# Patient Record
Sex: Male | Born: 1960 | Race: White | Hispanic: No | Smoking: Never smoker
Health system: Southern US, Community
[De-identification: ages and names within clinical notes are randomized; demographics above are authoritative.]

## PROBLEM LIST (undated history)

## (undated) DIAGNOSIS — F1010 Alcohol abuse, uncomplicated: Secondary | ICD-10-CM

## (undated) DIAGNOSIS — M24219 Disorder of ligament, unspecified shoulder: Secondary | ICD-10-CM

## (undated) DIAGNOSIS — I10 Essential (primary) hypertension: Secondary | ICD-10-CM

## (undated) DIAGNOSIS — F319 Bipolar disorder, unspecified: Secondary | ICD-10-CM

---

## 2014-05-31 ENCOUNTER — Encounter (HOSPITAL_BASED_OUTPATIENT_CLINIC_OR_DEPARTMENT_OTHER): Payer: Self-pay

## 2014-05-31 ENCOUNTER — Emergency Department (HOSPITAL_BASED_OUTPATIENT_CLINIC_OR_DEPARTMENT_OTHER)
Admission: EM | Admit: 2014-05-31 | Discharge: 2014-05-31 | Disposition: A | Discharge status: Home / Self Care | Payer: Self-pay | Attending: Emergency Medicine | Admitting: Emergency Medicine

## 2014-05-31 DIAGNOSIS — I10 Essential (primary) hypertension: Secondary | ICD-10-CM | POA: Insufficient documentation

## 2014-05-31 DIAGNOSIS — Z79899 Other long term (current) drug therapy: Secondary | ICD-10-CM | POA: Insufficient documentation

## 2014-05-31 DIAGNOSIS — Z8739 Personal history of other diseases of the musculoskeletal system and connective tissue: Secondary | ICD-10-CM | POA: Insufficient documentation

## 2014-05-31 DIAGNOSIS — F319 Bipolar disorder, unspecified: Secondary | ICD-10-CM | POA: Insufficient documentation

## 2014-05-31 HISTORY — DX: Alcohol abuse, uncomplicated: F10.10

## 2014-05-31 HISTORY — DX: Bipolar disorder, unspecified: F31.9

## 2014-05-31 HISTORY — DX: Disorder of ligament, unspecified shoulder: M24.219

## 2014-05-31 HISTORY — DX: Essential (primary) hypertension: I10

## 2014-05-31 LAB — BASIC METABOLIC PANEL
Anion gap: 9 (ref 5–15)
BUN: 16 mg/dL (ref 6–23)
CO2: 23 mmol/L (ref 19–32)
CREATININE: 0.95 mg/dL (ref 0.50–1.35)
Calcium: 9.6 mg/dL (ref 8.4–10.5)
Chloride: 106 mEq/L (ref 96–112)
GFR calc non Af Amer: >90 mL/min (ref >90)
Glucose, Bld: 107 mg/dL — ABNORMAL HIGH (ref 70–99)
Potassium: 3.7 mmol/L (ref 3.5–5.1)
Sodium: 138 mmol/L (ref 135–145)

## 2014-05-31 NOTE — ED Notes (Signed)
Pt reports he was being [facility] today and BP was elevated.  Staff sent pt to ED for evaluation.  Pt tells me sometimes he forgets to take his Metoprolol about 2-3 times a week.  Denies any symptoms other than anxiety.

## 2014-05-31 NOTE — ED Notes (Addendum)
Pt reports [facility] BP "191/111"-day 1 at [facility]

## 2014-05-31 NOTE — Discharge Instructions (Signed)
Hypertension °Hypertension, commonly called high blood pressure, is when the force of blood pumping through your arteries is too strong. Your arteries are the blood vessels that carry blood from your heart throughout your body. A blood pressure reading consists of a higher number over a lower number, such as 110/72. The higher number (systolic) is the pressure inside your arteries when your heart pumps. The lower number (diastolic) is the pressure inside your arteries when your heart relaxes. Ideally you want your blood pressure below 120/80. °Hypertension forces your heart to work harder to pump blood. Your arteries may become narrow or stiff. Having hypertension puts you at risk for heart disease, stroke, and other problems.  °RISK FACTORS °Some risk factors for high blood pressure are controllable. Others are not.  °Risk factors you cannot control include:  °· Race. You may be at higher risk if you are African American. °· Age. Risk increases with age. °· Gender. Men are at higher risk than women before age 45 years. After age 65, women are at higher risk than men. °Risk factors you can control include: °· Not getting enough exercise or physical activity. °· Being overweight. °· Getting too much fat, sugar, calories, or salt in your diet. °· Drinking too much alcohol. °SIGNS AND SYMPTOMS °Hypertension does not usually cause signs or symptoms. Extremely high blood pressure (hypertensive crisis) may cause headache, anxiety, shortness of breath, and nosebleed. °DIAGNOSIS  °To check if you have hypertension, your health care provider will measure your blood pressure while you are seated, with your arm held at the level of your heart. It should be measured at least twice using the same arm. Certain conditions can cause a difference in blood pressure between your right and left arms. A blood pressure reading that is higher than normal on one occasion does not mean that you need treatment. If one blood pressure reading  is high, ask your health care provider about having it checked again. °TREATMENT  °Treating high blood pressure includes making lifestyle changes and possibly taking medicine. Living a healthy lifestyle can help lower high blood pressure. You may need to change some of your habits. °Lifestyle changes may include: °· Following the DASH diet. This diet is high in fruits, vegetables, and whole grains. It is low in salt, red meat, and added sugars. °· Getting at least 2½ hours of brisk physical activity every week. °· Losing weight if necessary. °· Not smoking. °· Limiting alcoholic beverages. °· Learning ways to reduce stress. ° If lifestyle changes are not enough to get your blood pressure under control, your health care provider may prescribe medicine. You may need to take more than one. Work closely with your health care provider to understand the risks and benefits. °HOME CARE INSTRUCTIONS °· Have your blood pressure rechecked as directed by your health care provider.   °· Take medicines only as directed by your health care provider. Follow the directions carefully. Blood pressure medicines must be taken as prescribed. The medicine does not work as well when you skip doses. Skipping doses also puts you at risk for problems.   °· Do not smoke.   °· Monitor your blood pressure at home as directed by your health care provider.  °SEEK MEDICAL CARE IF:  °· You think you are having a reaction to medicines taken. °· You have recurrent headaches or feel dizzy. °· You have swelling in your ankles. °· You have trouble with your vision. °SEEK IMMEDIATE MEDICAL CARE IF: °· You develop a severe headache or confusion. °·   You have unusual weakness, numbness, or feel faint. °· You have severe chest or abdominal pain. °· You vomit repeatedly. °· You have trouble breathing. °MAKE SURE YOU:  °· Understand these instructions. °· Will watch your condition. °· Will get help right away if you are not doing well or get worse. °Document  Released: 05/20/2005 Document Revised: 10/04/2013 Document Reviewed: 03/12/2013 °ExitCare® Patient Information ©2015 [employer]. This information is not intended to replace advice given to you by your health care provider. Make sure you discuss any questions you have with your health care provider. ° °Insomnia °Insomnia is frequent trouble falling and/or staying asleep. Insomnia can be a long term problem or a short term problem. Both are common. Insomnia can be a short term problem when the wakefulness is related to a certain stress or worry. Long term insomnia is often related to ongoing stress during waking hours and/or poor sleeping habits. Overtime, sleep deprivation itself can make the problem worse. Every little thing feels more severe because you are overtired and your ability to cope is decreased. °CAUSES  °· Stress, anxiety, and depression. °· Poor sleeping habits. °· Distractions such as TV in the bedroom. °· Naps close to bedtime. °· Engaging in emotionally charged conversations before bed. °· Technical reading before sleep. °· Alcohol and other sedatives. They may make the problem worse. They can hurt normal sleep patterns and normal dream activity. °· Stimulants such as caffeine for several hours prior to bedtime. °· Pain syndromes and shortness of breath can cause insomnia. °· Exercise late at night. °· Changing time zones may cause sleeping problems (jet lag). °It is sometimes helpful to have someone observe your sleeping patterns. They should look for periods of not breathing during the night (sleep apnea). They should also look to see how long those periods last. If you live alone or observers are uncertain, you can also be observed at a sleep clinic where your sleep patterns will be professionally monitored. Sleep apnea requires a checkup and treatment. Give your caregivers your medical history. Give your caregivers observations your family has made about your sleep.  °SYMPTOMS  °· Not feeling  rested in the morning. °· Anxiety and restlessness at bedtime. °· Difficulty falling and staying asleep. °TREATMENT  °· Your caregiver may prescribe treatment for an underlying medical disorders. Your caregiver can give advice or help if you are using alcohol or other drugs for self-medication. Treatment of underlying problems will usually eliminate insomnia problems. °· Medications can be prescribed for short time use. They are generally not recommended for lengthy use. °· Over-the-counter sleep medicines are not recommended for lengthy use. They can be habit forming. °· You can promote easier sleeping by making lifestyle changes such as: °¨ Using relaxation techniques that help with breathing and reduce muscle tension. °¨ Exercising earlier in the day. °¨ Changing your diet and the time of your last meal. No night time snacks. °¨ Establish a regular time to go to bed. °· Counseling can help with stressful problems and worry. °· Soothing music and white noise may be helpful if there are background noises you cannot remove. °· Stop tedious detailed work at least one hour before bedtime. °HOME CARE INSTRUCTIONS  °· Keep a diary. Inform your caregiver about your progress. This includes any medication side effects. See your caregiver regularly. Take note of: °¨ Times when you are asleep. °¨ Times when you are awake during the night. °¨ The quality of your sleep. °¨ How you feel the next   day. °This information will help your caregiver care for you. °· Get out of bed if you are still awake after 15 minutes. Read or do some quiet activity. Keep the lights down. Wait until you feel sleepy and go back to bed. °· Keep regular sleeping and waking hours. Avoid naps. °· Exercise regularly. °· Avoid distractions at bedtime. Distractions include watching television or engaging in any intense or detailed activity like attempting to balance the household checkbook. °· Develop a bedtime ritual. Keep a familiar routine of bathing,  brushing your teeth, climbing into bed at the same time each night, listening to soothing music. Routines increase the success of falling to sleep faster. °· Use relaxation techniques. This can be using breathing and muscle tension release routines. It can also include visualizing peaceful scenes. You can also help control troubling or intruding thoughts by keeping your mind occupied with boring or repetitive thoughts like the old concept of counting sheep. You can make it more creative like imagining planting one beautiful flower after another in your backyard garden. °· During your day, work to eliminate stress. When this is not possible use some of the previous suggestions to help reduce the anxiety that accompanies stressful situations. °MAKE SURE YOU:  °· Understand these instructions. °· Will watch your condition. °· Will get help right away if you are not doing well or get worse. °Document Released: 05/17/2000 Document Revised: 08/12/2011 Document Reviewed: 06/17/2007 °ExitCare® Patient Information ©2015 [employer]. This information is not intended to replace advice given to you by your health care provider. Make sure you discuss any questions you have with your health care provider. ° °

## 2014-06-04 NOTE — ED Provider Notes (Signed)
CSN: [id number]     Arrival date & time 05/31/14  1142 History   First MD Initiated Contact with Patient 05/31/14 1253     Chief Complaint  Patient presents with  . Hypertension     (Consider location/radiation/quality/duration/timing/severity/associated sxs/prior Treatment) HPI Comments: Pt reports he was being admitted to [facility] today and BP was elevated. Staff sent pt to ED for evaluation. Pt tells me sometimes he forgets to take his Metoprolol about 2-3 times a week. He denies headache, chest pain, shortness of breath, numbness or weakness .     Patient is a 54 y.o. male presenting with hypertension.  Hypertension Pertinent negatives include no chest pain, no abdominal pain, no headaches and no shortness of breath.    Past Medical History  Diagnosis Date  . Hypertension   . Alcohol abuse   . Bipolar disorder   . Disorder of ligament of shoulder    History reviewed. No pertinent past surgical history. No family history on file. History  Substance Use Topics  . Smoking status: Never Smoker   . Smokeless tobacco: Not on file  . Alcohol Use: No     Comment: hx of abuse-in rehab    Review of Systems  Constitutional: Negative for fever, activity change, appetite change and fatigue.  HENT: Negative for congestion, facial swelling, rhinorrhea and trouble swallowing.   Eyes: Negative for photophobia and pain.  Respiratory: Negative for cough, chest tightness and shortness of breath.   Cardiovascular: Negative for chest pain and leg swelling.  Gastrointestinal: Negative for nausea, vomiting, abdominal pain, diarrhea and constipation.  Endocrine: Negative for polydipsia and polyuria.  Genitourinary: Negative for dysuria, urgency, decreased urine volume and difficulty urinating.  Musculoskeletal: Negative for back pain and gait problem.  Skin: Negative for color change, rash and wound.  Allergic/Immunologic: Negative for immunocompromised state.  Neurological: Negative  for dizziness, facial asymmetry, speech difficulty, weakness, numbness and headaches.  Psychiatric/Behavioral: Negative for confusion, decreased concentration and agitation.      Allergies  Review of patient's allergies indicates no known allergies.  Home Medications   Prior to Admission medications   Medication Sig Start Date End Date Taking? Authorizing Provider  Cholecalciferol (D3 MAXIMUM STRENGTH) 5000 UNITS capsule Take 5,000 Units by mouth daily.   Yes Historical Provider, MD  cholecalciferol (VITAMIN D) 400 UNITS TABS tablet Take 400 Units by mouth.   Yes Historical Provider, MD  Cyanocobalamin (B-12 DOTS) 500 MCG TBDP Take by mouth.   Yes Historical Provider, MD  DULoxetine (CYMBALTA) 30 MG capsule Take 30 mg by mouth 2 (two) times daily.   Yes Historical Provider, MD  lithium carbonate 300 MG capsule Take 600 mg by mouth at bedtime.   Yes Historical Provider, MD  metoprolol (LOPRESSOR) 100 MG tablet Take 100 mg by mouth 2 (two) times daily.   Yes Historical Provider, MD  Multiple Vitamins-Minerals (MULTIVITAMIN WITH MINERALS) tablet Take 1 tablet by mouth daily.   Yes Historical Provider, MD  traZODone (DESYREL) 50 MG tablet Take 50 mg by mouth at bedtime.   Yes Historical Provider, MD  vitamin A 8000 UNIT capsule Take 8,000 Units by mouth daily.   Yes Historical Provider, MD   BP 169/104 mmHg  Pulse 55  Temp(Src) 98.9 F (37.2 C) (Oral)  Resp 18  Ht  (1.727 m)  Wt 229 lb (103.874 kg)  BMI 34.83 kg/m2  SpO2 99% Physical Exam  Constitutional: He is oriented to person, place, and time. He appears well-developed and well-nourished. No distress.  HENT:  Head: Normocephalic and atraumatic.  Mouth/Throat: No oropharyngeal exudate.  Eyes: Pupils are equal, round, and reactive to light.  Neck: Normal range of motion. Neck supple.  Cardiovascular: Normal rate, regular rhythm and normal heart sounds.  Exam reveals no gallop and no friction rub.   No murmur  heard. Pulmonary/Chest: Effort normal and breath sounds normal. No respiratory distress. He has no wheezes. He has no rales.  Abdominal: Soft. Bowel sounds are normal. He exhibits no distension and no mass. There is no tenderness. There is no rebound and no guarding.  Musculoskeletal: Normal range of motion. He exhibits no edema or tenderness.  Neurological: He is alert and oriented to person, place, and time. He has normal strength. He displays no atrophy and no tremor. No cranial nerve deficit or sensory deficit. He exhibits normal muscle tone. He displays a negative Romberg sign. Coordination and gait normal. GCS eye subscore is 4. GCS verbal subscore is 5. GCS motor subscore is 6.  Skin: Skin is warm and dry.  Psychiatric: He has a normal mood and affect.    ED Course  Procedures (including critical care time) Labs Review Labs Reviewed  BASIC METABOLIC PANEL - Abnormal; Notable for the following:    Glucose, Bld 107 (*)    All other components within normal limits    Imaging Review No results found.   EKG Interpretation   Date/Time:  Tuesday May 31 2014 12:53:01 EST Ventricular Rate:  62 PR Interval:  112 QRS Duration: 106 QT Interval:  460 QTC Calculation: 466 R Axis:   26 Text Interpretation:  Sinus rhythm with Premature atrial complexes  Nonspecific T wave abnormality Prolonged QT Abnormal ECG No prior for  comparison Confirmed by DOCHERTY  MD, MEGAN ([id number]) on 05/31/2014 12:55:01  PM      MDM   Final diagnoses:  Essential hypertension    Pt is a 54 y.o. male with Pmhx as above who presents with complaint of hypertension after trying to be admitted today marked today.  He denies headache, chest pain, shortness of breath, decreased urinary output.  He has no signs of end organ damage on physical exam, including normal neuro exam.  Normal cardiopulmonary exam.  Labs show a normal creatinine.  He states that he did not take his metoprolol today.  I encouraged him  to take it regularly.  I do not feel he needs other medication adjustments at this time and can follow up with his primary physician.      Lupita Dawn evaluation in the Emergency Department is complete. It has been determined that no acute conditions requiring further emergency intervention are present at this time. The patient/guardian have been advised of the diagnosis and plan. We have discussed signs and symptoms that warrant return to the ED, such as changes or worsening in symptoms, chest pain, shortness of breath, numbness, weakness      Toy Cookey, MD 06/04/14 1734

## 2014-11-19 ENCOUNTER — Emergency Department (HOSPITAL_COMMUNITY)
Admission: EM | Admit: 2014-11-19 | Discharge: 2014-11-20 | Disposition: A | Discharge status: Home / Self Care | Payer: Self-pay

## 2014-11-19 ENCOUNTER — Encounter (HOSPITAL_COMMUNITY): Payer: Self-pay | Admitting: Nurse Practitioner

## 2014-11-19 DIAGNOSIS — R45851 Suicidal ideations: Secondary | ICD-10-CM

## 2014-11-19 DIAGNOSIS — Z8739 Personal history of other diseases of the musculoskeletal system and connective tissue: Secondary | ICD-10-CM | POA: Insufficient documentation

## 2014-11-19 DIAGNOSIS — F1010 Alcohol abuse, uncomplicated: Secondary | ICD-10-CM | POA: Insufficient documentation

## 2014-11-19 DIAGNOSIS — Z79899 Other long term (current) drug therapy: Secondary | ICD-10-CM | POA: Insufficient documentation

## 2014-11-19 DIAGNOSIS — F1994 Other psychoactive substance use, unspecified with psychoactive substance-induced mood disorder: Secondary | ICD-10-CM | POA: Diagnosis present

## 2014-11-19 DIAGNOSIS — F319 Bipolar disorder, unspecified: Secondary | ICD-10-CM | POA: Insufficient documentation

## 2014-11-19 DIAGNOSIS — I10 Essential (primary) hypertension: Secondary | ICD-10-CM | POA: Insufficient documentation

## 2014-11-19 LAB — COMPREHENSIVE METABOLIC PANEL
ALT: 37 U/L (ref 17–63)
AST: 38 U/L (ref 15–41)
Albumin: 4.2 g/dL (ref 3.5–5.0)
Alkaline Phosphatase: 52 U/L (ref 38–126)
Anion gap: 11 (ref 5–15)
BUN: 16 mg/dL (ref 6–20)
CALCIUM: 9.6 mg/dL (ref 8.9–10.3)
CO2: 22 mmol/L (ref 22–32)
Chloride: 107 mmol/L (ref 101–111)
Creatinine, Ser: 0.79 mg/dL (ref 0.61–1.24)
GFR calc non Af Amer: >60 mL/min (ref >60)
GLUCOSE: 104 mg/dL — AB (ref 65–99)
Potassium: 4.1 mmol/L (ref 3.5–5.1)
Sodium: 140 mmol/L (ref 135–145)
TOTAL PROTEIN: 7.7 g/dL (ref 6.5–8.1)
Total Bilirubin: 0.6 mg/dL (ref 0.3–1.2)

## 2014-11-19 LAB — ACETAMINOPHEN LEVEL: Acetaminophen (Tylenol), Serum: <10 ug/mL — ABNORMAL LOW (ref 10–30)

## 2014-11-19 LAB — SALICYLATE LEVEL

## 2014-11-19 LAB — CBC
HCT: 49.6 % (ref 39.0–52.0)
Hemoglobin: 17.3 g/dL — ABNORMAL HIGH (ref 13.0–17.0)
MCH: 33.2 pg (ref 26.0–34.0)
MCHC: 34.9 g/dL (ref 30.0–36.0)
MCV: 95.2 fL (ref 78.0–100.0)
Platelets: 257 10*3/uL (ref 150–400)
RBC: 5.21 MIL/uL (ref 4.22–5.81)
RDW: 14 % (ref 11.5–15.5)
WBC: 9.7 10*3/uL (ref 4.0–10.5)

## 2014-11-19 LAB — ETHANOL: Alcohol, Ethyl (B): 343 mg/dL (ref <5)

## 2014-11-19 MED ORDER — LITHIUM CARBONATE 300 MG PO CAPS
600.0000 mg | ORAL_CAPSULE | Freq: Two times a day (BID) | ORAL | Status: DC
  Administered 2014-11-19 – 2014-11-20 (×3): 600 mg via ORAL
  Filled 2014-11-19 (×3): qty 2

## 2014-11-19 MED ORDER — HYDROXYZINE HCL 50 MG PO TABS
50.0000 mg | ORAL_TABLET | ORAL | Status: DC | PRN
  Administered 2014-11-19: 50 mg via ORAL
  Filled 2014-11-19: qty 2
  Filled 2014-11-19: qty 1

## 2014-11-19 MED ORDER — TRAZODONE HCL 50 MG PO TABS
50.0000 mg | ORAL_TABLET | Freq: Two times a day (BID) | ORAL | Status: DC
  Administered 2014-11-19 – 2014-11-20 (×3): 50 mg via ORAL
  Filled 2014-11-19 (×3): qty 1

## 2014-11-19 MED ORDER — THIAMINE HCL 100 MG/ML IJ SOLN: 100.0000 mg | Freq: Every day | INTRAMUSCULAR | Status: DC

## 2014-11-19 MED ORDER — VITAMIN B-1 100 MG PO TABS
100.0000 mg | ORAL_TABLET | Freq: Every day | ORAL | Status: DC
  Administered 2014-11-19 – 2014-11-20 (×2): 100 mg via ORAL
  Filled 2014-11-19 (×2): qty 1

## 2014-11-19 MED ORDER — METOPROLOL TARTRATE 25 MG PO TABS
100.0000 mg | ORAL_TABLET | Freq: Two times a day (BID) | ORAL | Status: DC
  Administered 2014-11-19 – 2014-11-20 (×3): 100 mg via ORAL
  Filled 2014-11-19 (×3): qty 4

## 2014-11-19 MED ORDER — SERTRALINE HCL 50 MG PO TABS
50.0000 mg | ORAL_TABLET | Freq: Every day | ORAL | Status: DC
  Administered 2014-11-19 – 2014-11-20 (×2): 50 mg via ORAL
  Filled 2014-11-19 (×2): qty 1

## 2014-11-19 MED ORDER — LORAZEPAM 1 MG PO TABS: 0.0000 mg | ORAL_TABLET | Freq: Two times a day (BID) | ORAL | Status: DC

## 2014-11-19 MED ORDER — LORAZEPAM 1 MG PO TABS: 0.0000 mg | ORAL_TABLET | Freq: Four times a day (QID) | ORAL | Status: DC

## 2014-11-19 MED ORDER — MELOXICAM 15 MG PO TABS
15.0000 mg | ORAL_TABLET | Freq: Every day | ORAL | Status: DC
  Administered 2014-11-19 – 2014-11-20 (×2): 15 mg via ORAL
  Filled 2014-11-19 (×2): qty 1

## 2014-11-19 MED ORDER — CLONIDINE HCL 0.1 MG PO TABS
0.1000 mg | ORAL_TABLET | Freq: Once | ORAL | Status: AC
  Administered 2014-11-19: 0.1 mg via ORAL
  Filled 2014-11-19: qty 1

## 2014-11-19 NOTE — ED Notes (Signed)
William Ramsey is sitting at bedside since 13:15

## 2014-11-19 NOTE — ED Notes (Signed)
Pt ambulated to the nursing station to use the phone.

## 2014-11-19 NOTE — ED Notes (Signed)
He reports he is here today for help with alcoholism. He reports drinking alcohol daily and last drank today. He denies any other substance use. He denies any physical complaints. His sister is with him and states they decided to com etoday because he's had thoughts of hurting himself. Pt does report thoughts of harming himself, but does not state a specific plan. He denies HI

## 2014-11-19 NOTE — ED Provider Notes (Signed)
CSN: [id number]     Arrival date & time 11/19/14  1224 History   First MD Initiated Contact with Patient 11/19/14 1252     Chief Complaint  Patient presents with  . Alcohol Problem     (Consider location/radiation/quality/duration/timing/severity/associated sxs/prior Treatment) HPI Comments: Patient with a history of Alcoholism presents today requesting alcohol detox and also stating that he is having suicidal thoughts.  He states that he has been drinking alcohol daily for the past 20 years.  When asked how much he drinks he states, "a lot" and would not elaborate on this further.  He states that his last drink was 9 AM this morning.  He reports that he has been having suicidal thoughts.  Patient is unclear if he has a specific plan, but states "I could find a way."  He denies HI.  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  He denies any physical symptoms at this time.  No headache, vision changes, chest pain, focal weakness, numbness, or tingling.    The history is provided by the patient.    Past Medical History  Diagnosis Date  . Hypertension   . Alcohol abuse   . Bipolar disorder   . Disorder of ligament of shoulder    History reviewed. No pertinent past surgical history. History reviewed. No pertinent family history. History  Substance Use Topics  . Smoking status: Never Smoker   . Smokeless tobacco: Not on file  . Alcohol Use: No     Comment: hx of abuse-in rehab    Review of Systems  All other systems reviewed and are negative.     Allergies  Bee venom  Home Medications   Prior to Admission medications   Medication Sig Start Date End Date Taking? Authorizing Provider  Cholecalciferol (D3 MAXIMUM STRENGTH) 5000 UNITS capsule Take 5,000 Units by mouth daily.    Historical Provider, MD  cholecalciferol (VITAMIN D) 400 UNITS TABS tablet Take 400 Units by mouth.    Historical Provider, MD  Cyanocobalamin (B-12 DOTS) 500 MCG TBDP Take  by mouth.    Historical Provider, MD  DULoxetine (CYMBALTA) 30 MG capsule Take 30 mg by mouth 2 (two) times daily.    Historical Provider, MD  lithium carbonate 300 MG capsule Take 600 mg by mouth at bedtime.    Historical Provider, MD  metoprolol (LOPRESSOR) 100 MG tablet Take 100 mg by mouth 2 (two) times daily.    Historical Provider, MD  Multiple Vitamins-Minerals (MULTIVITAMIN WITH MINERALS) tablet Take 1 tablet by mouth daily.    Historical Provider, MD  traZODone (DESYREL) 50 MG tablet Take 50 mg by mouth at bedtime.    Historical Provider, MD  vitamin A 8000 UNIT capsule Take 8,000 Units by mouth daily.    Historical Provider, MD   BP 200/118 mmHg  Pulse 98  Temp(Src) 97.5 F (36.4 C) (Oral)  Resp 18  Ht  (1.727 m)  Wt 217 lb 11.2 oz (98.748 kg)  BMI 33.11 kg/m2  SpO2 96% Physical Exam  Constitutional: He appears well-developed and well-nourished.  HENT:  Head: Normocephalic and atraumatic.  Mouth/Throat: Oropharynx is clear and moist.  Eyes: EOM are normal. Pupils are equal, round, and reactive to light.  Neck: Normal range of motion. Neck supple.  Cardiovascular: Normal rate, regular rhythm and normal heart sounds.   Pulmonary/Chest: Effort normal and breath sounds normal.  Musculoskeletal: Normal range of motion.  Neurological: He is alert. He has normal  strength. No cranial nerve deficit or sensory deficit. Gait normal.  Skin: Skin is warm and dry.  Psychiatric: His speech is normal and behavior is normal. His affect is labile. He expresses suicidal ideation. He expresses no homicidal ideation. He expresses suicidal plans. He expresses no homicidal plans.  Nursing note and vitals reviewed.   ED Course  Procedures (including critical care time) Labs Review Labs Reviewed  ACETAMINOPHEN LEVEL - Abnormal; Notable for the following:    Acetaminophen (Tylenol), Serum <10 (*)    All other components within normal limits  CBC - Abnormal; Notable for the following:     Hemoglobin 17.3 (*)    All other components within normal limits  COMPREHENSIVE METABOLIC PANEL - Abnormal; Notable for the following:    Glucose, Bld 104 (*)    All other components within normal limits  ETHANOL - Abnormal; Notable for the following:    Alcohol, Ethyl (B) 343 (*)    All other components within normal limits  SALICYLATE LEVEL  URINE RAPID DRUG SCREEN, HOSP PERFORMED    Imaging Review No results found.   EKG Interpretation None      MDM   Final diagnoses:  None   Patient with a history of Bipolar, HTN, and Alcoholism presents today requesting alcohol detox and also with suicidal thoughts.  He is vague about whether or not he has a suicide plan.  He reports drinking alcohol daily.  Last drink 9 AM this morning.  Labs unremarkable aside from an alcohol level of 343.  Patient also found to be hypertensive in the ED.  No signs of Hypertensive Emergency.  Patient given his home dose of Metoprolol and also given a dose of oral clonidine to treat HTN.  Patient evaluated by TTS who recommended inpatient psychiatric admission.  Placement pending.    Santiago Glad, PA-C 11/19/14 1613  Santiago Glad, PA-C 11/19/14 1614  Azalia Bilis, MD 11/19/14 [phone]

## 2014-11-19 NOTE — ED Notes (Signed)
Asked pt to get up and use bathrom d/t no void this shif. See ADL/output flowsheet.

## 2014-11-19 NOTE — ED Notes (Signed)
Pt at desk for second phone call.

## 2014-11-19 NOTE — BH Assessment (Addendum)
Tele Assessment Note   William Ramsey is an 54 y.o. male.  -Clinician discussed patient with Santiago Glad, PA.  Patient came in because of vague SI and wanting to detox from ETOH.  Patient has suicide plan to drink himself to death.  He cannot quantify how much he actually drinks per day.  He says "I drink as much as I can get in me."  Patient has drank to day because his BALwas 343 at 13:06.  He does admit to withdrawal seizures when detoxing and last one was a year ago.  Patient is agitated and restless, getting up to pace several times during assessment.  Pt says that nobody cares for him or helps him.  He did call his sister today to get some assistance with coming to [facility].    Patient has had several inpatient hospitalizations.  He also has outpatient care currently through [facility] in [facility].  Patient says that he takes meds as prescribed but still suffers from depression, anxiety, etc for which he admits he drinks to self medicate also.  Patient denies other illicit drug use.  Patient becomes tearful saying he lived in [place] for a while and has been back to [place] for the last 18 months.  He says that he has applied for the Affordable Care Act insurance and could not get coverage.  At this point he starts crying and cursing the president.  Patient says he has little support from family.  He says his ex-wife "tolerates" him.  Patient lacks firm decision making skills and will say "does that sound acceptable to you" several times during assessment.    -Clinician discussed patient care with Assunta Found, NP who recommends inpatient care due to suicidality.  Patient care also discussed with Santiago Glad, PA.  We discussed patient being referred to other facilities since there is not an appropriate bed at [facility] at this time.  Clinician let her know that if patient acted like they wanted to leave then the EDP could talk to them and decide on whether to IVC.  Patient was informed that there were no beds  at [facility] but that other facilities would be contacted about bed availability.    Axis I: Bipolar, mixed, Substance Induced Mood Disorder and 303.90 ETOH use d/o severe Axis II: Deferred Axis III:  Past Medical History  Diagnosis Date  . Hypertension   . Alcohol abuse   . Bipolar disorder   . Disorder of ligament of shoulder    Axis IV: economic problems, occupational problems, other psychosocial or environmental problems, problems with access to health care services and problems with primary support group Axis V: 31-40 impairment in reality testing  Past Medical History:  Past Medical History  Diagnosis Date  . Hypertension   . Alcohol abuse   . Bipolar disorder   . Disorder of ligament of shoulder     History reviewed. No pertinent past surgical history.  Family History: History reviewed. No pertinent family history.  Social History:  reports that he has never smoked. He does not have any smokeless tobacco history on file. He reports that he does not drink alcohol or use illicit drugs.  Additional Social History:  Alcohol / Drug Use Pain Medications: See PTA medication list Prescriptions: See PTA medication list Over the Counter: See PTA medication list History of alcohol / drug use?: Yes Longest period of sobriety (when/how long): 9 months Withdrawal Symptoms: Seizures, Agitation, Weakness, Patient aware of relationship between substance abuse and physical/medical  complications, Change in blood pressure Onset of Seizures: Withdrawal seizures Date of most recent seizure: One year ago Substance #1 Name of Substance 1: ETOH (liquor) 1 - Age of First Use: "In my 20's" 1 - Amount (size/oz): As much as I can get in my body per day. 1 - Frequency: Daily 1 - Duration: On-going 1 - Last Use / Amount: Today  CIWA: CIWA-Ar BP: (!) 177/112 mmHg Pulse Rate: 93 Nausea and Vomiting: no nausea and no vomiting Tactile Disturbances: none Tremor: no tremor Auditory Disturbances: not  present Paroxysmal Sweats: no sweat visible Visual Disturbances: not present Anxiety: no anxiety, at ease Headache, Fullness in Head: none present Agitation: normal activity Orientation and Clouding of Sensorium: oriented and can do serial additions CIWA-Ar Total: 0 COWS:    PATIENT STRENGTHS: (choose at least two) Average or above average intelligence Capable of independent living Communication skills  Allergies:  Allergies  Allergen Reactions  . Bee Venom     Home Medications:  (Not in a hospital admission)  OB/GYN Status:  No LMP for male patient.  General Assessment Data Location of Assessment: [facility] ED TTS Assessment: In system Is this a Tele or Face-to-Face Assessment?: Tele Assessment Is this an Initial Assessment or a Re-assessment for this encounter?: Initial Assessment Marital status: Married Is patient pregnant?: No Pregnancy Status: No Living Arrangements: Spouse/significant other Can pt return to current living arrangement?: Yes Admission Status: Voluntary Is patient capable of signing voluntary admission?: Yes Referral Source: Self/Family/Friend     Crisis Care Plan Living Arrangements: Spouse/significant other Name of Psychiatrist: None Name of Therapist: None  Education Status Highest grade of school patient has completed: 4 year degree  Risk to self with the past 6 months Suicidal Ideation: Yes-Currently Present Has patient been a risk to self within the past 6 months prior to admission? : Yes Suicidal Intent: No-Not Currently/Within Last 6 Months Has patient had any suicidal intent within the past 6 months prior to admission? : Yes Is patient at risk for suicide?: Yes Suicidal Plan?: Yes-Currently Present Has patient had any suicidal plan within the past 6 months prior to admission? : Yes Specify Current Suicidal Plan: Drink himself to death Access to Means: Yes Specify Access to Suicidal Means: ETOh What has been your use of drugs/alcohol  within the last 12 months?: ETOH use daily Previous Attempts/Gestures: No How many times?: 0 Other Self Harm Risks: No Triggers for Past Attempts: None known Intentional Self Injurious Behavior: None Family Suicide History: No Recent stressful life event(s): Turmoil (Comment) (Was living in [place] for a few years untill 18 months ago.) Persecutory voices/beliefs?: Yes Depression: Yes Depression Symptoms: Despondent, Tearfulness, Guilt, Loss of interest in usual pleasures, Feeling worthless/self pity, Isolating Substance abuse history and/or treatment for substance abuse?: Yes Suicide prevention information given to non-admitted patients: Not applicable  Risk to Others within the past 6 months Homicidal Ideation: No Does patient have any lifetime risk of violence toward others beyond the six months prior to admission? : No Thoughts of Harm to Others: No Current Homicidal Intent: No Current Homicidal Plan: No Access to Homicidal Means: No Identified Victim: No one History of harm to others?: No Assessment of Violence: None Noted Violent Behavior Description: None noted Does patient have access to weapons?: No Criminal Charges Pending?: No Does patient have a court date: No Is patient on probation?: No  Psychosis Hallucinations: None noted Delusions: None noted  Mental Status Report Appearance/Hygiene: Disheveled, In scrubs Eye Contact: Good Motor Activity: Agitation, Restlessness  Speech: Incoherent, Pressured, Abusive Level of Consciousness: Alert Mood: Depressed, Anxious, Despair, Empty, Irritable, Helpless, Sad, Worthless, low self-esteem Affect: Anxious, Apprehensive, Blunted, Depressed, Sad Anxiety Level: Severe Thought Processes: Tangential Judgement: Impaired Orientation: Person, Place, Time, Situation Obsessive Compulsive Thoughts/Behaviors: None  Cognitive Functioning Concentration: Decreased Memory: Recent Impaired, Remote Intact IQ: Average Insight:  Poor Impulse Control: Poor Appetite: Good Weight Loss: 0 Weight Gain:  (Pt reports binge eating.) Sleep: Decreased Total Hours of Sleep:  (<4H/D) Vegetative Symptoms: Staying in bed, Decreased grooming  ADLScreening [facility] Assessment Services) Patient's cognitive ability adequate to safely complete daily activities?: Yes Patient able to express need for assistance with ADLs?: Yes Independently performs ADLs?: Yes (appropriate for developmental age)  Prior Inpatient Therapy Prior Inpatient Therapy: Yes Prior Therapy Dates: Several months ago Prior Therapy Facilty/Provider(s): [facility] Reason for Treatment: Detox  Prior Outpatient Therapy Prior Outpatient Therapy: Yes Prior Therapy Dates: 18 months Prior Therapy Facilty/Provider(s): [facility] in [place] Reason for Treatment: med management Does patient have an ACCT team?: No Does patient have Intensive In-House Services?  : No Does patient have [facility] services? : No Does patient have P4CC services?: No  ADL Screening (condition at time of admission) Patient's cognitive ability adequate to safely complete daily activities?: Yes Is the patient deaf or have difficulty hearing?: No Does the patient have difficulty seeing, even when wearing glasses/contacts?: No (Wears glasses) Does the patient have difficulty concentrating, remembering, or making decisions?: No Patient able to express need for assistance with ADLs?: Yes Does the patient have difficulty dressing or bathing?: No Independently performs ADLs?: Yes (appropriate for developmental age) Does the patient have difficulty walking or climbing stairs?: Yes Weakness of Legs: Left (Bunion on left foot) Weakness of Arms/Hands: None       Abuse/Neglect Assessment (Assessment to be complete while patient is alone) Physical Abuse: Denies Verbal Abuse: Denies Sexual Abuse: Denies Exploitation of patient/patient's resources: Denies Self-Neglect: Denies     [employer]chant [employer] officer (For  Healthcare) Does patient have an advance directive?: No Would patient like information on creating an advanced directive?: No - patient declined information    Additional Information 1:1 In Past 12 Months?: No CIRT Risk: No Elopement Risk: No Does patient have medical clearance?: Yes     Disposition:  Disposition Initial Assessment Completed for this Encounter: Yes Disposition of Patient: Inpatient treatment program, Referred to Type of inpatient treatment program: Adult Patient referred to: Other (Comment) (To be reviewed with NP)  Beatriz Stallion Ray 11/19/2014 2:43 PM

## 2014-11-19 NOTE — Progress Notes (Signed)
Disposition CSW completed referrals for this patient to the following inpatient psych facilities:  [facility] [place]   CSW will continue to assist with this patients placement needs.  Seward Speck [facility] Disposition CSW [phone]

## 2014-11-20 DIAGNOSIS — R45851 Suicidal ideations: Secondary | ICD-10-CM

## 2014-11-20 DIAGNOSIS — F1910 Other psychoactive substance abuse, uncomplicated: Secondary | ICD-10-CM

## 2014-11-20 DIAGNOSIS — F1010 Alcohol abuse, uncomplicated: Secondary | ICD-10-CM | POA: Diagnosis present

## 2014-11-20 DIAGNOSIS — F1994 Other psychoactive substance use, unspecified with psychoactive substance-induced mood disorder: Secondary | ICD-10-CM | POA: Diagnosis present

## 2014-11-20 DIAGNOSIS — F329 Major depressive disorder, single episode, unspecified: Secondary | ICD-10-CM

## 2014-11-20 LAB — RAPID URINE DRUG SCREEN, HOSP PERFORMED
AMPHETAMINES: NOT DETECTED
BARBITURATES: NOT DETECTED
BENZODIAZEPINES: NOT DETECTED
Cocaine: NOT DETECTED
Opiates: NOT DETECTED
Tetrahydrocannabinol: NOT DETECTED

## 2014-11-20 MED ORDER — CHLORDIAZEPOXIDE HCL 25 MG PO CAPS: ORAL_CAPSULE | ORAL | Status: DC

## 2014-11-20 NOTE — Consult Note (Signed)
Telepsych Consultation   Reason for Consult:  Intoxicated, suicidal statement Referring Physician:  EDP  Patient Identification: William Ramsey MRN:  [id number] Principal Diagnosis: <principal problem not specified> Diagnosis:  There are no active problems to display for this patient.   Total Time spent with patient: 25 minutes  Subjective:   William Ramsey is a 54 y.o. male patient admitted with reports of making suicidal statements while extremely intoxicated (BAL mid 300's). Pt sober now and denies suicidal ideation. Denies homicidal ideation and psychosis and does not appear to be responding to internal stimuli. Pt reports that he has a positive therapeutic relationship with [facility] for bipolar and that he is taking all his medication (lithium/zoloft) and that he was "drunk and depressed about some things, but not suicidal now that I'm sober". Pt denies any history of self-harm or suicide attempts and is able to contract for safety. Pt reports no guns in the house. Family members not available for comment. However, affect is congruent with denial of suicidal ideation and all objective data collected supports his stability for discharge including his insight for followup and his current therapeutic relationship outpatient.  HPI:   William Ramsey is an 55 y.o. male.  -Clinician discussed patient with Hyman Bible, PA. Patient came in because of vague SI and wanting to detox from ETOH.  Patient has suicide plan to drink himself to death. He cannot quantify how much he actually drinks per day. He says "I drink as much as I can get in me." Patient has drank to day because his BALwas 343 at 13:06. He does admit to withdrawal seizures when detoxing and last one was a year ago. Patient is agitated and restless, getting up to pace several times during assessment. Pt says that nobody cares for him or helps him. He did call his sister today to get some assistance with coming to [facility].    Patient has had several inpatient hospitalizations. He also has outpatient care currently through [facility]. Patient says that he takes meds as prescribed but still suffers from depression, anxiety, etc for which he admits he drinks to self medicate also. Patient denies other illicit drug use.  Patient becomes tearful saying he lived in [place] for a while and has been back to [place] for the last 18 months. He says that he has applied for the Affordable Care Act insurance and could not get coverage. At this point he starts crying and cursing the president. Patient says he has little support from family. He says his ex-wife "tolerates" him. Patient lacks firm decision making skills and will say "does that sound acceptable to you" several times during assessment.   -Clinician discussed patient care with Earleen Newport, NP who recommends inpatient care due to suicidality. Patient care also discussed with Hyman Bible, PA. We discussed patient being referred to other facilities since there is not an appropriate bed at [facility] at this time. Clinician let her know that if patient acted like they wanted to leave then the EDP could talk to them and decide on whether to IVC. Patient was informed that there were no beds at [facility] but that other facilities would be contacted about bed availability.    Past Medical History:  Past Medical History  Diagnosis Date  . Hypertension   . Alcohol abuse   . Bipolar disorder   . Disorder of ligament of shoulder    History reviewed. No pertinent past surgical history. Family History: History reviewed. No pertinent  family history. Social History:  History  Alcohol Use No    Comment: hx of abuse-in rehab     History  Drug Use No    History   Social History  . Marital Status: Unknown    Spouse Name: N/A  . Number of Children: N/A  . Years of Education: N/A   Social History Main Topics  . Smoking status: Never Smoker   . Smokeless tobacco: Not  on file  . Alcohol Use: No     Comment: hx of abuse-in rehab  . Drug Use: No  . Sexual Activity: Not on file   Other Topics Concern  . None   Social History Narrative   Additional Social History:    Pain Medications: See PTA medication list Prescriptions: See PTA medication list Over the Counter: See PTA medication list History of alcohol / drug use?: Yes Longest period of sobriety (when/how long): 9 months Withdrawal Symptoms: Seizures, Agitation, Weakness, Patient aware of relationship between substance abuse and physical/medical complications, Change in blood pressure Onset of Seizures: Withdrawal seizures Date of most recent seizure: One year ago Name of Substance 1: ETOH (liquor) 1 - Age of First Use: "In my 20's" 1 - Amount (size/oz): As much as I can get in my body per day. 1 - Frequency: Daily 1 - Duration: On-going 1 - Last Use / Amount: Today                   Allergies:   Allergies  Allergen Reactions  . Bee Venom     Labs:  Results for orders placed or performed during the hospital encounter of 11/19/14 (from the past 48 hour(s))  Acetaminophen level     Status: Abnormal   Collection Time: 11/19/14  1:06 PM  Result Value Ref Range   Acetaminophen (Tylenol), Serum <10 (L) 10 - 30 ug/mL    Comment:        THERAPEUTIC CONCENTRATIONS VARY SIGNIFICANTLY. A RANGE OF 10-30 ug/mL MAY BE AN EFFECTIVE CONCENTRATION FOR MANY PATIENTS. HOWEVER, SOME ARE BEST TREATED AT CONCENTRATIONS OUTSIDE THIS RANGE. ACETAMINOPHEN CONCENTRATIONS >150 ug/mL AT 4 HOURS AFTER INGESTION AND >50 ug/mL AT 12 HOURS AFTER INGESTION ARE OFTEN ASSOCIATED WITH TOXIC REACTIONS.   CBC     Status: Abnormal   Collection Time: 11/19/14  1:06 PM  Result Value Ref Range   WBC 9.7 4.0 - 10.5 K/uL   RBC 5.21 4.22 - 5.81 MIL/uL   Hemoglobin 17.3 (H) 13.0 - 17.0 g/dL   HCT 49.6 39.0 - 52.0 %   MCV 95.2 78.0 - 100.0 fL   MCH 33.2 26.0 - 34.0 pg   MCHC 34.9 30.0 - 36.0 g/dL   RDW  14.0 11.5 - 15.5 %   Platelets 257 150 - 400 K/uL  Comprehensive metabolic panel     Status: Abnormal   Collection Time: 11/19/14  1:06 PM  Result Value Ref Range   Sodium 140 135 - 145 mmol/L   Potassium 4.1 3.5 - 5.1 mmol/L   Chloride 107 101 - 111 mmol/L   CO2 22 22 - 32 mmol/L   Glucose, Bld 104 (H) 65 - 99 mg/dL   BUN 16 6 - 20 mg/dL   Creatinine, Ser 0.79 0.61 - 1.24 mg/dL   Calcium 9.6 8.9 - 10.3 mg/dL   Total Protein 7.7 6.5 - 8.1 g/dL   Albumin 4.2 3.5 - 5.0 g/dL   AST 38 15 - 41 U/L   ALT 37 17 - 63  U/L   Alkaline Phosphatase 52 38 - 126 U/L   Total Bilirubin 0.6 0.3 - 1.2 mg/dL   GFR calc non Af Amer >60 >60 mL/min   GFR calc Af Amer >60 >60 mL/min    Comment: (NOTE) The eGFR has been calculated using the CKD EPI equation. This calculation has not been validated in all clinical situations. eGFR's persistently <60 mL/min signify possible Chronic Kidney Disease.    Anion gap 11 5 - 15  Ethanol (ETOH)     Status: Abnormal   Collection Time: 11/19/14  1:06 PM  Result Value Ref Range   Alcohol, Ethyl (B) 343 (HH) <5 mg/dL    Comment:        LOWEST DETECTABLE LIMIT FOR SERUM ALCOHOL IS 5 mg/dL FOR MEDICAL PURPOSES ONLY REPEATED TO VERIFY CRITICAL RESULT CALLED TO, READ BACK BY AND VERIFIED WITH: MATT BEASLEY,RN AT 1354 11/19/14 BY ZBEECH.   Salicylate level     Status: None   Collection Time: 11/19/14  1:06 PM  Result Value Ref Range   Salicylate Lvl <0.3 2.8 - 30.0 mg/dL  Urine rapid drug screen (hosp performed)not at [facility]     Status: None   Collection Time: 11/19/14 11:51 PM  Result Value Ref Range   Opiates NONE DETECTED NONE DETECTED   Cocaine NONE DETECTED NONE DETECTED   Benzodiazepines NONE DETECTED NONE DETECTED   Amphetamines NONE DETECTED NONE DETECTED   Tetrahydrocannabinol NONE DETECTED NONE DETECTED   Barbiturates NONE DETECTED NONE DETECTED    Comment:        DRUG SCREEN FOR MEDICAL PURPOSES ONLY.  IF CONFIRMATION IS NEEDED FOR ANY PURPOSE,  NOTIFY LAB WITHIN 5 DAYS.        LOWEST DETECTABLE LIMITS FOR URINE DRUG SCREEN Drug Class       Cutoff (ng/mL) Amphetamine      1000 Barbiturate      200 Benzodiazepine   546 Tricyclics       568 Opiates          300 Cocaine          300 THC              50     Vitals: Blood pressure 177/92, pulse 57, temperature 97.8 F (36.6 C), temperature source Oral, resp. rate 18, height '5\' 8"'  (1.727 m), weight 98.748 kg (217 lb 11.2 oz), SpO2 98 %.  Risk to Self: Suicidal Ideation: Yes-Currently Present Suicidal Intent: No-Not Currently/Within Last 6 Months Is patient at risk for suicide?: Yes Suicidal Plan?: Yes-Currently Present Specify Current Suicidal Plan: Drink himself to death Access to Means: Yes Specify Access to Suicidal Means: ETOh What has been your use of drugs/alcohol within the last 12 months?: ETOH use daily How many times?: 0 Other Self Harm Risks: No Triggers for Past Attempts: None known Intentional Self Injurious Behavior: None Risk to Others: Homicidal Ideation: No Thoughts of Harm to Others: No Current Homicidal Intent: No Current Homicidal Plan: No Access to Homicidal Means: No Identified Victim: No one History of harm to others?: No Assessment of Violence: None Noted Violent Behavior Description: None noted Does patient have access to weapons?: No Criminal Charges Pending?: No Does patient have a court date: No Prior Inpatient Therapy: Prior Inpatient Therapy: Yes Prior Therapy Dates: Several months ago Prior Therapy Facilty/Provider(s): [facility] Reason for Treatment: Detox Prior Outpatient Therapy: Prior Outpatient Therapy: Yes Prior Therapy Dates: 18 months Prior Therapy Facilty/Provider(s): [facility] in [place] Reason for Treatment: med management Does patient  have an ACCT team?: No Does patient have Intensive In-House Services?  : No Does patient have [facility] services? : No Does patient have P4CC services?: No  Current Facility-Administered  Medications  Medication Dose Route Frequency Provider Last Rate Last Dose  . hydrOXYzine (ATARAX/VISTARIL) tablet 50 mg  50 mg Oral Q4H PRN Heather Laisure, PA-C   50 mg at 11/19/14 1754  . lithium carbonate capsule 600 mg  600 mg Oral BID Hyman Bible, PA-C   600 mg at 11/19/14 2203  . LORazepam (ATIVAN) tablet 0-4 mg  0-4 mg Oral 4 times per day Hyman Bible, PA-C   0 mg at 11/19/14 1349   Followed by  . [START ON 11/21/2014] LORazepam (ATIVAN) tablet 0-4 mg  0-4 mg Oral Q12H Heather Laisure, PA-C      . meloxicam (MOBIC) tablet 15 mg  15 mg Oral Daily Heather Laisure, PA-C   15 mg at 11/19/14 1714  . metoprolol tartrate (LOPRESSOR) tablet 100 mg  100 mg Oral BID Hyman Bible, PA-C   100 mg at 11/19/14 2203  . sertraline (ZOLOFT) tablet 50 mg  50 mg Oral Daily Heather Laisure, PA-C   50 mg at 11/19/14 1616  . thiamine (VITAMIN B-1) tablet 100 mg  100 mg Oral Daily Hyman Bible, PA-C   100 mg at 11/19/14 1358   Or  . thiamine (B-1) injection 100 mg  100 mg Intravenous Daily Heather Laisure, PA-C      . traZODone (DESYREL) tablet 50 mg  50 mg Oral BID Hyman Bible, PA-C   50 mg at 11/19/14 2203   Current Outpatient Prescriptions  Medication Sig Dispense Refill  . hydrOXYzine (VISTARIL) 25 MG capsule Take 50 mg by mouth every 4 (four) hours as needed for anxiety.    Marland Kitchen lithium carbonate 300 MG capsule Take 600 mg by mouth 2 (two) times daily.     . meloxicam (MOBIC) 15 MG tablet Take 15 mg by mouth daily.    . metoprolol (LOPRESSOR) 100 MG tablet Take 100 mg by mouth 2 (two) times daily.    . sertraline (ZOLOFT) 50 MG tablet Take 50 mg by mouth daily.    Marland Kitchen topiramate (TOPAMAX) 50 MG tablet Take 50 mg by mouth 3 (three) times daily.    . traZODone (DESYREL) 50 MG tablet Take 50 mg by mouth 2 (two) times daily.       Musculoskeletal: UTO, camera  Psychiatric Specialty Exam: Physical Exam  Review of Systems  Psychiatric/Behavioral: Positive for depression and substance abuse.  Negative for suicidal ideas. The patient is nervous/anxious.   All other systems reviewed and are negative.   Blood pressure 177/92, pulse 57, temperature 97.8 F (36.6 C), temperature source Oral, resp. rate 18, height '5\' 8"'  (1.727 m), weight 98.748 kg (217 lb 11.2 oz), SpO2 98 %.Body mass index is 33.11 kg/(m^2).  General Appearance: Casual and Fairly Groomed  Engineer, water::  Good  Speech:  Clear and Coherent and Normal Rate  Volume:  Normal  Mood:  Euthymic  Affect:  Appropriate and Congruent  Thought Process:  Coherent and Goal Directed  Orientation:  Full (Time, Place, and Person)  Thought Content:  WDL  Suicidal Thoughts:  No  Homicidal Thoughts:  No  Memory:  Immediate;   Fair Recent;   Fair Remote;   Fair  Judgement:  Fair  Insight:  Good  Psychomotor Activity:  Normal  Concentration:  Good  Recall:  Good  Fund of Knowledge:Good  Language: Fair  Akathisia:  No  Handed:    AIMS (if indicated):     Assets:  Communication Skills Desire for Improvement Resilience Social Support  ADL's:  Intact  Cognition: WNL  Sleep:      Medical Decision Making: Established Problem, Stable/Improving (1), Self-Limited or Minor (1), Review of Psycho-Social Stressors (1) and Review or order clinical lab tests (1)   Treatment Plan Summary: Alcohol abuse, improving, no longer intoxicated, warrants outpatient   Plan:  No evidence of imminent risk to self or others at present.   Patient does not meet criteria for psychiatric inpatient admission. Supportive therapy provided about ongoing stressors. Refer to IOP. Discussed crisis plan, support from social network, calling 911, coming to the Emergency Department, and calling Suicide Hotline.  Disposition:  -Discharge home -Continue outpatient therapy and psychiatry at [facility] (current) -Have pt sign no-harm contract  Benjamine Mola, FNP-BC 11/20/2014 9:34 AM      Case discussed with me as above  Neita Garnet , MD

## 2014-11-20 NOTE — ED Notes (Signed)
Pt signed safety contract and stated he was no longer SI/HI but states that he has no where to go and no ride. EDP aware and states to contact social work.

## 2014-11-20 NOTE — ED Notes (Signed)
Case Management working on getting pt a room at the shelter and a bus pass.   Will D/C when resources are available.

## 2014-11-20 NOTE — ED Notes (Signed)
Pt remains awake and alert, no sleeping yet this shift.

## 2014-11-20 NOTE — ED Notes (Signed)
TTS at bedside. 

## 2014-11-20 NOTE — Discharge Instructions (Signed)
If you were given medicines take as directed.  If you are on coumadin or contraceptives realize their levels and effectiveness is altered by many different medicines.  If you have any reaction (rash, tongues swelling, other) to the medicines stop taking and see a physician.   Go to [facility]. If your blood pressure was elevated in the ER make sure you follow up for management with a primary doctor or return for chest pain, shortness of breath or stroke symptoms.  Please follow up as directed and return to the ER or see a physician for new or worsening symptoms.  Thank you. Filed Vitals:   11/19/14 2348 11/20/14 0639 11/20/14 1153 11/20/14 1200  BP:  177/92 182/81 182/81  Pulse:  57 63 63  Temp: 98.7 F (37.1 C) 97.8 F (36.6 C) 98.4 F (36.9 C)   TempSrc: Oral Oral Oral   Resp: 16 18 20    Height:      Weight:      SpO2: 95% 98% 98%

## 2018-10-08 ENCOUNTER — Encounter (HOSPITAL_BASED_OUTPATIENT_CLINIC_OR_DEPARTMENT_OTHER): Payer: Self-pay | Admitting: *Deleted

## 2018-10-08 ENCOUNTER — Emergency Department (HOSPITAL_BASED_OUTPATIENT_CLINIC_OR_DEPARTMENT_OTHER): Payer: PRIVATE HEALTH INSURANCE

## 2018-10-08 ENCOUNTER — Other Ambulatory Visit: Payer: Self-pay

## 2018-10-08 ENCOUNTER — Emergency Department (HOSPITAL_BASED_OUTPATIENT_CLINIC_OR_DEPARTMENT_OTHER)
Admission: EM | Admit: 2018-10-08 | Discharge: 2018-10-08 | Disposition: A | Discharge status: Home / Self Care | Payer: PRIVATE HEALTH INSURANCE | Attending: Emergency Medicine | Admitting: Emergency Medicine

## 2018-10-08 DIAGNOSIS — M25562 Pain in left knee: Secondary | ICD-10-CM | POA: Diagnosis not present

## 2018-10-08 DIAGNOSIS — I10 Essential (primary) hypertension: Secondary | ICD-10-CM | POA: Insufficient documentation

## 2018-10-08 DIAGNOSIS — Z79899 Other long term (current) drug therapy: Secondary | ICD-10-CM | POA: Diagnosis not present

## 2018-10-08 NOTE — ED Provider Notes (Signed)
[facility] EMERGENCY DEPARTMENT Provider Note   CSN: [id number] Arrival date & time: 10/08/18  1035    History   Chief Complaint Chief Complaint  Patient presents with  . Knee Pain    HPI William Ramsey is a 58 y.o. male.     58 y/o male with a PMH of HTN, alcohol abuse, SI presents to the ED with a chief complaint of left knee pain x yesterday. Patient reports he was walking on his yard when he stepped into a pothole and had his left knee twist to the other side. Reports throbbing pain along the anterior aspect of the knee joint, states the pain is worse with ambulation and bending of the left knee. He has taken tramadol along with applied heat to the area with some relieve in symptoms. Patient is ambulatory with a cane while in the ED. He is currently on eliquis. He denies any other trauma, fever, other complaints.      Past Medical History:  Diagnosis Date  . Alcohol abuse   . Bipolar disorder (HCC)   . Disorder of ligament of shoulder   . Hypertension     Patient Active Problem List   Diagnosis Date Noted  . Alcohol abuse 11/20/2014  . Substance induced mood disorder (HCC) 11/20/2014  . Suicidal ideation 11/20/2014    History reviewed. No pertinent surgical history.      Home Medications    Prior to Admission medications   Medication Sig Start Date End Date Taking? Authorizing Provider  losartan-hydrochlorothiazide (HYZAAR) 100-12.5 MG tablet Take 1 tablet by mouth daily.   Yes [provider]  chlordiazePOXIDE (LIBRIUM) 25 MG capsule  PO TID x 1D, then 25-50mg  PO BID X 1D, then 25-50mg  PO QD X 1D 11/20/14   Blane Ohara, MD  hydrOXYzine (VISTARIL) 25 MG capsule Take 50 mg by mouth every 4 (four) hours as needed for anxiety.    [provider]  lithium carbonate 300 MG capsule Take 600 mg by mouth 2 (two) times daily.     [provider]  meloxicam (MOBIC) 15 MG tablet Take 15 mg by mouth daily.    [provider]  metoprolol (LOPRESSOR) 100 MG tablet Take 100 mg by mouth 2 (two) times daily.    [provider]  sertraline (ZOLOFT) 50 MG tablet Take 50 mg by mouth daily.    [provider]  topiramate (TOPAMAX) 50 MG tablet Take 50 mg by mouth 3 (three) times daily.    [provider]  traZODone (DESYREL) 50 MG tablet Take 50 mg by mouth 2 (two) times daily.     [provider]    Family History No family history on file.  Social History Social History   Tobacco Use  . Smoking status: Never Smoker  Substance Use Topics  . Alcohol use: No    Comment: hx of abuse-in rehab  . Drug use: No     Allergies   Bee venom   Review of Systems Review of Systems  Constitutional: Negative for fever.  Musculoskeletal: Positive for arthralgias. Negative for gait problem, joint swelling and myalgias.  Skin: Negative for color change, rash and wound.     Physical Exam Updated Vital Signs BP (!) 141/93 (BP Location: Left Arm)   Pulse 72   Temp 98.3 F (36.8 C) (Oral)   Resp 18   Ht  (1.727 m)   Wt 90.7 kg   SpO2 98%   BMI 30.41  kg/m   Physical Exam Vitals signs and nursing note reviewed.  Constitutional:      Appearance: He is well-developed.  HENT:     Head: Normocephalic and atraumatic.  Eyes:     General: No scleral icterus.    Pupils: Pupils are equal, round, and reactive to light.  Neck:     Musculoskeletal: Normal range of motion.  Cardiovascular:     Heart sounds: Normal heart sounds.  Pulmonary:     Effort: Pulmonary effort is normal.     Breath sounds: Normal breath sounds. No wheezing.  Chest:     Chest wall: No tenderness.  Abdominal:     General: Bowel sounds are normal. There is no distension.     Palpations: Abdomen is soft.     Tenderness: There is no abdominal tenderness.  Musculoskeletal:        General: No tenderness or deformity.     Right knee: Normal.     Left knee: He exhibits decreased range of  motion and erythema. He exhibits no swelling, no effusion, no ecchymosis, no deformity, no laceration, normal alignment and no LCL laxity.     Right ankle: Normal.     Left ankle: Normal.     Comments: TTP below the patella, pain with flexion > than extension of the knee.   Skin:    General: Skin is warm and dry.  Neurological:     Mental Status: He is alert and oriented to person, place, and time.      ED Treatments / Results  Labs (all labs ordered are listed, but only abnormal results are displayed) Labs Reviewed - No data to display  EKG None  Radiology Dg Knee 2 Views Left  Result Date: 10/08/2018 CLINICAL DATA:  Fall, lateral knee pain EXAM: LEFT KNEE - 1-2 VIEW COMPARISON:  None. FINDINGS: No fracture or dislocation is seen. The joint spaces are preserved. Mild prepatellar soft tissue swelling. Small suprapatellar knee joint effusion. IMPRESSION: No fracture or dislocation is seen. Mild prepatellar soft tissue swelling. Electronically Signed   By: Charline Bills M.D.   On: 10/08/2018 11:10    Procedures Procedures (including critical care time)  Medications Ordered in ED Medications - No data to display   Initial Impression / Assessment and Plan / ED Course  I have reviewed the triage vital signs and the nursing notes.  Pertinent labs & imaging results that were available during my care of the patient were reviewed by me and considered in my medical decision making (see chart for details).   Patient with a past medical history of alcohol abuse, hypertension presents to the emergency department with complaints of left knee pain after injury yesterday while walking in his yard.  During evaluation there is no laceration present, abrasion, warmth to the left knee is appreciated.  Tenderness to palpation below patella region, has pain with flexion but better with extension.  Reports taking tramadol for pain relief with some improvement in symptoms.  X-ray of the left knee  showed No fracture or dislocation is seen.    Mild prepatellar soft tissue swelling.     Results have been discussed with patient, will place a knee sleeve at this time to help with tissue swelling, rice therapy is encouraged.  Patient will also be provided a referral to orthopedics to follow-up.  He may also alternate ibuprofen or Tylenol for his pain.  Patient also reports having tramadol at home, we will not be prescribing narcotics at this time.  Patient understands and agrees with management.  With stable vital signs, return precautions provided.  Patient has requested a work note, I will provide him with a work note to return back on Tuesday as he reports this is his next day of working.  Portions of this note were generated with Scientist, clinical (histocompatibility and immunogenetics)Dragon dictation software. Dictation errors may occur despite best attempts at proofreading.     Final Clinical Impressions(s) / ED Diagnoses   Final diagnoses:  Acute pain of left knee    ED Discharge Orders    None       Claude MangesSoto, Markies Mowatt, PA-C 10/08/18 1125    Little, Ambrose Finlandachel Morgan, MD 10/08/18 [phone]

## 2018-10-08 NOTE — ED Notes (Signed)
Patient transported to X-ray 

## 2018-10-08 NOTE — Discharge Instructions (Addendum)
You may alternate ibuprofen or Tylenol to help with your pain.  We have placed your left knee on a sleeve, you may wear this for comfort, please keep your leg elevated along with ice to the left knee and switch every 20 minutes if possible.  I have also provided a referral to Dr. Norton Blizzard, please schedule an orthopedist follow-up at your earliest convenience.

## 2018-10-08 NOTE — ED Triage Notes (Signed)
Stepped in hole yesterday while mowing yard  Twisted left knee,  Sore, tender and swollen  Ambulatory w diff  Using a cane

## 2019-09-07 ENCOUNTER — Ambulatory Visit: Payer: PRIVATE HEALTH INSURANCE | Admitting: Family Medicine

## 2019-09-07 VITALS — BP 200/110 | HR 74 | Ht 68.0 in | Wt 215.0 lb

## 2019-09-07 DIAGNOSIS — I160 Hypertensive urgency: Secondary | ICD-10-CM

## 2019-09-07 DIAGNOSIS — Z789 Other specified health status: Secondary | ICD-10-CM

## 2019-09-07 NOTE — Progress Notes (Signed)
Subjective:     Patient ID: William Ramsey, male   DOB: Dec 27, 1960, 59 y.o.   MRN: [id number]  HPI  William [facility] today for his required wellness visit for his insurance. He does not have a PCP, but would like to get established with someone.  He has a cardiologist, Dr. Otho Perl, who he sees regularly, last visit was in November 2020. He has never had any colon cancer screening tests. He has a urologist he sees for his prostate. He reports currently high levels of stress at work due to some changes taking place. He reports he has had difficulty sleeping because of this. He denies any chest pain, shortness of breath, headaches, vision changes. He states he does not eat the best and does not exercise. He does not have a BP cuff at home to check his BP with, but states it normally is in the 440H-474Q systolic. He reports he is compliant with his medications.   Past Medical History:  Diagnosis Date  . Alcohol abuse   . Bipolar disorder ([place])   . Disorder of ligament of shoulder   . Hypertension    Allergies  Allergen Reactions  . Bee Venom     Current Outpatient Medications:  .  apixaban (ELIQUIS) 5 MG TABS tablet, Take 1 tablet by mouth twice daily, Disp: , Rfl:  .  carvedilol (COREG) 12.5 MG tablet, TAKE 3 TABLETS BY MOUTH TWICE DAILY WITH MEALS, Disp: , Rfl:  .  hydrALAZINE (APRESOLINE) 50 MG tablet, TAKE 1 TABLET BY MOUTH EVERY 6 HOURS, Disp: , Rfl:  .  losartan (COZAAR) 100 MG tablet, Take 100 mg by mouth daily., Disp: , Rfl:    Review of Systems  Constitutional: Negative for chills, fatigue, fever and unexpected weight change.  HENT: Negative for congestion, ear pain, sinus pressure, sinus pain and sore throat.   Eyes: Negative for discharge and visual disturbance.  Respiratory: Negative for cough, shortness of breath and wheezing.   Cardiovascular: Negative for chest pain and leg swelling.  Gastrointestinal: Negative for abdominal pain, blood in  stool, constipation, diarrhea, nausea and vomiting.  Genitourinary: Negative for difficulty urinating and hematuria.  Skin: Negative for color change.  Neurological: Negative for dizziness, facial asymmetry, speech difficulty, weakness, light-headedness, numbness and headaches.  Hematological: Negative for adenopathy.  All other systems reviewed and are negative.      Objective:   Physical Exam Vitals reviewed.  Constitutional:      General: He is not in acute distress.    Appearance: He is obese. He is not toxic-appearing.  HENT:     Head: Normocephalic and atraumatic.  Eyes:     General:        Right eye: No discharge.        Left eye: No discharge.     Pupils: Pupils are equal, round, and reactive to light.  Cardiovascular:     Rate and Rhythm: Normal rate and regular rhythm.     Heart sounds: Normal heart sounds.  Pulmonary:     Effort: Pulmonary effort is normal. No respiratory distress.     Breath sounds: Normal breath sounds.  Musculoskeletal:     Cervical back: Neck supple.  Skin:    General: Skin is warm and dry.  Neurological:     General: No focal deficit present.     Mental Status: He is alert and oriented to person, place, and time.     Motor: No weakness.  Gait: Gait normal.  Psychiatric:        Mood and Affect: Mood normal.        Behavior: Behavior normal.    Today's Vitals   09/07/19 1027  BP: (!) 200/110  Pulse: 74  SpO2: 98%  Weight: 215 lb (97.5 kg)  Height: 5\' 8"  (1.727 m)   Body mass index is 32.69 kg/m.     Assessment:     1. Hypertensive urgency Recommend patient go to the ED for treatment of his severely elevated BP. Rechecked his BP manually several times and remained in the 200s over 110-120s. He denies any symptoms other than stress from work. He is compliant with his current medications. He is resistant to going to the ED, states he has to work tomorrow. I recommended he contact his cardiologist today regarding his elevated BP  and medication management, and get their opinion on what he should do. He needs to f/u with them sooner than his appt in August. If they suggest he go to the emergency department then he states he will go. He understands that he is at increased risk of heart attack and stroke. If he develops chest pain, shortness of breath, headache, changes in his vision, slurred speech, facial drooping, weakness, or any other concerning symptoms he is to call 911 and go to ED. He verbalized understanding. Recommend he also keep a check on his BP readings, have someone at work check this for him at least a couple times a week. He will also f/u here in 2 weeks.   2. Participant in health and wellness plan Strongly encouraged patient to get established with a PCP. Gave him the number to [facility] physician referral service line to help him with getting that set up. He should discuss colon cancer screening with his PCP. Also strongly recommend weight loss, with healthy eating and exercise. Discussed stress management strategies as well as DASH diet. F/u here in 2 weeks.        Plan:     See above

## 2019-09-07 NOTE — Patient Instructions (Addendum)
-Please call your cardiologist regarding your elevated blood pressure.  -It is very important for you to establish care with a primary care provider.   Managing Stress, Adult Feeling a certain amount of stress is normal. Stress helps our body and mind get ready to deal with the demands of life. Stress hormones can motivate you to do well at work and meet your responsibilities. However severe or long-lasting (chronic) stress can affect your mental and physical health. Chronic stress puts you at higher risk for anxiety, depression, and other health problems like digestive problems, muscle aches, heart disease, high blood pressure, and stroke. What are the causes? Common causes of stress include:  Demands from work, such as deadlines, feeling overworked, or having long hours.  Pressures at home, such as money issues, disagreements with a spouse, or parenting issues.  Pressures from major life changes, such as divorce, moving, loss of a loved one, or chronic illness. You may be at higher risk for stress-related problems if you do not get enough sleep, are in poor health, do not have emotional support, or have a mental health disorder like anxiety or depression. How to recognize stress Stress can make you:  Have trouble sleeping.  Feel sad, anxious, irritable, or overwhelmed.  Lose your appetite.  Overeat or want to eat unhealthy foods.  Want to use drugs or alcohol. Stress can also cause physical symptoms, such as:  Sore, tense muscles, especially in the shoulders and neck.  Headaches.  Trouble breathing.  A faster heart rate.  Stomach pain, nausea, or vomiting.  Diarrhea or constipation.  Trouble concentrating. Follow these instructions at home: Lifestyle  Identify the source of your stress and your reaction to it. See a therapist who can help you change your reactions.  When there are stressful events: ? Talk about it with family, friends, or co-workers. ? Try to think  realistically about stressful events and not ignore them or overreact. ? Try to find the positives in a stressful situation and not focus on the negatives. ? Cut back on responsibilities at work and home, if possible. Ask for help from friends or family members if you need it.  Find ways to cope with stress, such as: ? Meditation. ? Deep breathing. ? Yoga or tai chi. ? Progressive muscle relaxation. ? Doing art, playing music, or reading. ? Making time for fun activities. ? Spending time with family and friends.  Get support from family, friends, or spiritual resources. Eating and drinking  Eat a healthy diet. This includes: ? Eating foods that are high in fiber, such as beans, whole grains, and fresh fruits and vegetables. ? Limiting foods that are high in fat and processed sugars, such as fried and sweet foods.  Do not skip meals or overeat.  Drink enough fluid to keep your urine pale yellow. Alcohol use  Do not drink alcohol if: ? Your health care provider tells you not to drink. ? You are pregnant, may be pregnant, or are planning to become pregnant.  Drinking alcohol is a way some people try to ease their stress. This can be dangerous, so if you drink alcohol: ? Limit how much you use to:  0-1 drink a day for women.  0-2 drinks a day for men. ? Be aware of how much alcohol is in your drink. In the [place] one drink equals one 12 oz bottle of beer (355 mL), one 5 oz glass of wine (148 mL), or one 1 oz glass of hard liquor (  44 mL). Activity   Include 30 minutes of exercise in your daily schedule. Exercise is a good stress reducer.  Include time in your day for an activity that you find relaxing. Try taking a walk, going on a bike ride, reading a book, or listening to music.  Schedule your time in a way that lowers stress, and keep a consistent schedule. Prioritize what is most important to get done. General instructions  Get enough sleep. Try to go to sleep and get up  at about the same time every day.  Take over-the-counter and prescription medicines only as told by your health care provider.  Do not use any products that contain nicotine or tobacco, such as cigarettes, e-cigarettes, and chewing tobacco. If you need help quitting, ask your health care provider.  Do not use drugs or smoke to cope with stress.  Keep all follow-up visits as told by your health care provider. This is important. Where to find support  Talk with your health care provider about stress management or finding a support group.  Find a therapist to work with you on your stress management techniques. Contact a health care provider if:  Your stress symptoms get worse.  You are unable to manage your stress at home.  You are struggling to stop using drugs or alcohol. Get help right away if:  You may be a danger to yourself or others.  You have any thoughts of death or suicide. If you ever feel like you may hurt yourself or others, or have thoughts about taking your own life, get help right away. You can go to your nearest emergency department or call:  Your local emergency services (911 in the [place]  A suicide crisis helpline, such as the [place] at (308) 373-8627. This is open 24 hours a day. Summary  Feeling a certain amount of stress is normal, but severe or long-lasting (chronic) stress can affect your mental and physical health.  Chronic stress can put you at higher risk for anxiety, depression, and other health problems like digestive problems, muscle aches, heart disease, high blood pressure, and stroke.  You may be at higher risk for stress-related problems if you do not get enough sleep, are in poor health, lack emotional support, or have a mental health disorder like anxiety or depression.  Identify the source of your stress and your reaction to it. Try talking about stressful events with family, friends, or co-workers, finding a  coping method, or getting support from spiritual resources.  If you need more help, talk with your health care provider about finding a support group or a mental health therapist. This information is not intended to replace advice given to you by your health care provider. Make sure you discuss any questions you have with your health care provider. Document Revised: 12/16/2018 Document Reviewed: 12/16/2018 Elsevier Patient Education  2020 [place] Screening  Colorectal cancer screening is a group of tests that are used to check for colorectal cancer before symptoms develop. Colorectal refers to the colon and rectum. The colon and rectum are located at the end of the digestive tract and carry bowel movements out of the body. Who should have screening? All adults starting at age 76 until age 4 should have screening. Your health care provider may recommend screening at age 63. You will have tests every 1-10 years, depending on your results and the type of screening test. You may have screening tests starting at an earlier  age, or more frequently than other people, if you have any of the following risk factors:  A personal or family history of colorectal cancer or abnormal growths (polyps).  Inflammatory bowel disease, such as ulcerative colitis or Crohn's disease.  A history of having radiation treatment to the abdomen or pelvic area for cancer.  Colorectal cancer symptoms, such as changes in bowel habits or blood in your stool.  A type of colon cancer syndrome that is passed from parent to child (hereditary), such as: ? Lynch syndrome. ? Familial adenomatous polyposis. ? Turcot syndrome. ? Peutz-Jeghers syndrome. Screening recommendations for adults who are 56-32 years old vary depending on health. How is screening done? There are several types of colorectal screening tests. You may have one or more of the following:  Guaiac-based fecal occult blood testing.  For this test, a stool (feces) sample is checked for hidden (occult) blood, which could be a sign of colorectal cancer.  Fecal immunochemical test (FIT). For this test, a stool sample is checked for blood, which could be a sign of colorectal cancer.  Stool DNA test. For this test, a stool sample is checked for blood and changes in DNA that could lead to colorectal cancer.  Sigmoidoscopy. During this test, a thin, flexible tube with a camera on the end (sigmoidoscope) is used to examine the rectum and the lower colon.  Colonoscopy. During this test, a long, flexible tube with a camera on the end (colonoscope) is used to examine the entire colon and rectum. With a colonoscopy, it is possible to take a sample of tissue (biopsy) and remove small polyps during the test.  Virtual colonoscopy. Instead of a colonoscope, this type of colonoscopy uses X-rays (CT scan) and computers to produce images of the colon and rectum. What are the benefits of screening? Screening reduces your risk for colorectal cancer and can help identify cancer at an early stage, when the cancer can be removed or treated more easily. It is common for polyps to form in the lining of the colon, especially as you age. These polyps may be cancerous or become cancerous over time. Screening can identify these polyps. What are the risks of screening? Each screening test may have different risks.  Stool sample tests have fewer risks than other types of screening tests. However, you may need more tests to confirm results from a stool sample test.  Screening tests that involve X-rays expose you to low levels of radiation, which may slightly increase your cancer risk. The benefit of detecting cancer outweighs the slight increase in risk.  Screening tests such as sigmoidoscopy and colonoscopy may place you at risk for bleeding, intestinal damage, infection, or a reaction to medicines given during the exam. Talk with your health care provider  to understand your risk for colorectal cancer and to make a screening plan that is right for you. Questions to ask your health care provider  When should I start colorectal cancer screening?  What is my risk for colorectal cancer?  How often do I need screening?  Which screening tests do I need?  How do I get my test results?  What do my results mean? Where to find more information Learn more about colorectal cancer screening from:  The [place]: www.cancer.org  The [employer]: www.cancer.gov Summary  Colorectal cancer screening is a group of tests used to check for colorectal cancer before symptoms develop.  Screening reduces your risk for colorectal cancer and can help identify cancer at  an early stage, when the cancer can be removed or treated more easily.  All adults starting at age 63 until age 25 should have screening. Your health care provider may recommend screening at age 29.  You may have screening tests starting at an earlier age, or more frequently than other people, if you have certain risk factors.  Talk with your health care provider to understand your risk for colorectal cancer and to make a screening plan that is right for you. This information is not intended to replace advice given to you by your health care provider. Make sure you discuss any questions you have with your health care provider. Document Revised: 09/09/2018 Document Reviewed: 02/19/2017 Elsevier Patient Education  [place] DASH stands for "Dietary Approaches to Stop Hypertension." The DASH eating plan is a healthy eating plan that has been shown to reduce high blood pressure (hypertension). It may also reduce your risk for type 2 diabetes, heart disease, and stroke. The DASH eating plan may also help with weight loss. What are tips for following this plan?  General guidelines  Avoid eating more than 2,300 mg (milligrams) of salt  (sodium) a day. If you have hypertension, you may need to reduce your sodium intake to 1,500 mg a day.  Limit alcohol intake to no more than 1 drink a day for nonpregnant women and 2 drinks a day for men. One drink equals 12 oz of beer, 5 oz of wine, or 1 oz of hard liquor.  Work with your health care provider to maintain a healthy body weight or to lose weight. Ask what an ideal weight is for you.  Get at least 30 minutes of exercise that causes your heart to beat faster (aerobic exercise) most days of the week. Activities may include walking, swimming, or biking.  Work with your health care provider or diet and nutrition specialist (dietitian) to adjust your eating plan to your individual calorie needs. Reading food labels   Check food labels for the amount of sodium per serving. Choose foods with less than 5 percent of the Daily Value of sodium. Generally, foods with less than 300 mg of sodium per serving fit into this eating plan.  To find whole grains, look for the word "whole" as the first word in the ingredient list. Shopping  Buy products labeled as "low-sodium" or "no salt added."  Buy fresh foods. Avoid canned foods and premade or frozen meals. Cooking  Avoid adding salt when cooking. Use salt-free seasonings or herbs instead of table salt or sea salt. Check with your health care provider or pharmacist before using salt substitutes.  Do not fry foods. Cook foods using healthy methods such as baking, boiling, grilling, and broiling instead.  Cook with heart-healthy oils, such as olive, canola, soybean, or sunflower oil. Meal planning  Eat a balanced diet that includes: ? 5 or more servings of fruits and vegetables each day. At each meal, try to fill half of your plate with fruits and vegetables. ? Up to 6-8 servings of whole grains each day. ? Less than 6 oz of lean meat, poultry, or fish each day. A 3-oz serving of meat is about the same size as a deck of cards. One egg  equals 1 oz. ? 2 servings of low-fat dairy each day. ? A serving of nuts, seeds, or beans 5 times each week. ? Heart-healthy fats. Healthy fats called Omega-3 fatty acids are found in foods such as  flaxseeds and coldwater fish, like sardines, salmon, and mackerel.  Limit how much you eat of the following: ? Canned or prepackaged foods. ? Food that is high in trans fat, such as fried foods. ? Food that is high in saturated fat, such as fatty meat. ? Sweets, desserts, sugary drinks, and other foods with added sugar. ? Full-fat dairy products.  Do not salt foods before eating.  Try to eat at least 2 vegetarian meals each week.  Eat more home-cooked food and less restaurant, buffet, and fast food.  When eating at a restaurant, ask that your food be prepared with less salt or no salt, if possible. What foods are recommended? The items listed may not be a complete list. Talk with your dietitian about what dietary choices are best for you. Grains Whole-grain or whole-wheat bread. Whole-grain or whole-wheat pasta. Brown rice. [place]. Bulgur. Whole-grain and low-sodium cereals. Pita bread. Low-fat, low-sodium crackers. Whole-wheat flour tortillas. Vegetables Fresh or frozen vegetables (raw, steamed, roasted, or grilled). Low-sodium or reduced-sodium tomato and vegetable juice. Low-sodium or reduced-sodium tomato sauce and tomato paste. Low-sodium or reduced-sodium canned vegetables. Fruits All fresh, dried, or frozen fruit. Canned fruit in natural juice (without added sugar). Meat and other protein foods Skinless chicken or [place]. Ground chicken or [place]. Pork with fat trimmed off. Fish and seafood. Egg whites. Dried beans, peas, or lentils. Unsalted nuts, nut butters, and seeds. Unsalted canned beans. Lean cuts of beef with fat trimmed off. Low-sodium, lean deli meat. Dairy Low-fat (1%) or fat-free (skim) milk. Fat-free, low-fat, or reduced-fat cheeses. Nonfat, low-sodium ricotta or  cottage cheese. Low-fat or nonfat yogurt. Low-fat, low-sodium cheese. Fats and oils Soft margarine without trans fats. Vegetable oil. Low-fat, reduced-fat, or light mayonnaise and salad dressings (reduced-sodium). Canola, safflower, olive, soybean, and sunflower oils. Avocado. Seasoning and other foods Herbs. Spices. Seasoning mixes without salt. Unsalted popcorn and pretzels. Fat-free sweets. What foods are not recommended? The items listed may not be a complete list. Talk with your dietitian about what dietary choices are best for you. Grains Baked goods made with fat, such as croissants, muffins, or some breads. Dry pasta or rice meal packs. Vegetables Creamed or fried vegetables. Vegetables in a cheese sauce. Regular canned vegetables (not low-sodium or reduced-sodium). Regular canned tomato sauce and paste (not low-sodium or reduced-sodium). Regular tomato and vegetable juice (not low-sodium or reduced-sodium). Angie Fava. Olives. Fruits Canned fruit in a light or heavy syrup. Fried fruit. Fruit in cream or butter sauce. Meat and other protein foods Fatty cuts of meat. Ribs. Fried meat. Berniece Salines. Sausage. Bologna and other processed lunch meats. Salami. Fatback. Hotdogs. Bratwurst. Salted nuts and seeds. Canned beans with added salt. Canned or smoked fish. Whole eggs or egg yolks. Chicken or [place] with skin. Dairy Whole or 2% milk, cream, and half-and-half. Whole or full-fat cream cheese. Whole-fat or sweetened yogurt. Full-fat cheese. Nondairy creamers. Whipped toppings. Processed cheese and cheese spreads. Fats and oils Butter. Stick margarine. Lard. Shortening. Ghee. Bacon fat. Tropical oils, such as coconut, palm kernel, or palm oil. Seasoning and other foods Salted popcorn and pretzels. Onion salt, garlic salt, seasoned salt, table salt, and sea salt. Worcestershire sauce. Tartar sauce. Barbecue sauce. Teriyaki sauce. Soy sauce, including reduced-sodium. Steak sauce. Canned and packaged  gravies. Fish sauce. Oyster sauce. Cocktail sauce. Horseradish that you find on the shelf. Ketchup. Mustard. Meat flavorings and tenderizers. Bouillon cubes. Hot sauce and Tabasco sauce. Premade or packaged marinades. Premade or packaged taco seasonings. Relishes. Regular salad dressings. Where  to find more information:  National Heart, Lung, and [place]: https://wilson-eaton.com/  American Heart Association: www.heart.org Summary  The DASH eating plan is a healthy eating plan that has been shown to reduce high blood pressure (hypertension). It may also reduce your risk for type 2 diabetes, heart disease, and stroke.  With the DASH eating plan, you should limit salt (sodium) intake to 2,300 mg a day. If you have hypertension, you may need to reduce your sodium intake to 1,500 mg a day.  When on the DASH eating plan, aim to eat more fresh fruits and vegetables, whole grains, lean proteins, low-fat dairy, and heart-healthy fats.  Work with your health care provider or diet and nutrition specialist (dietitian) to adjust your eating plan to your individual calorie needs. This information is not intended to replace advice given to you by your health care provider. Make sure you discuss any questions you have with your health care provider. Document Revised: 05/02/2017 Document Reviewed: 05/13/2016 Elsevier Patient Education  2020 [employer].

## 2019-09-21 ENCOUNTER — Ambulatory Visit: Payer: Self-pay

## 2020-04-08 IMAGING — CR LEFT KNEE - 1-2 VIEW
2 series · 2 of 2 positions shown · non-contrast
Comparison: None.

CLINICAL DATA: Fall, lateral knee pain

EXAM:
LEFT KNEE - 1-2 VIEW

[t knee ap left]
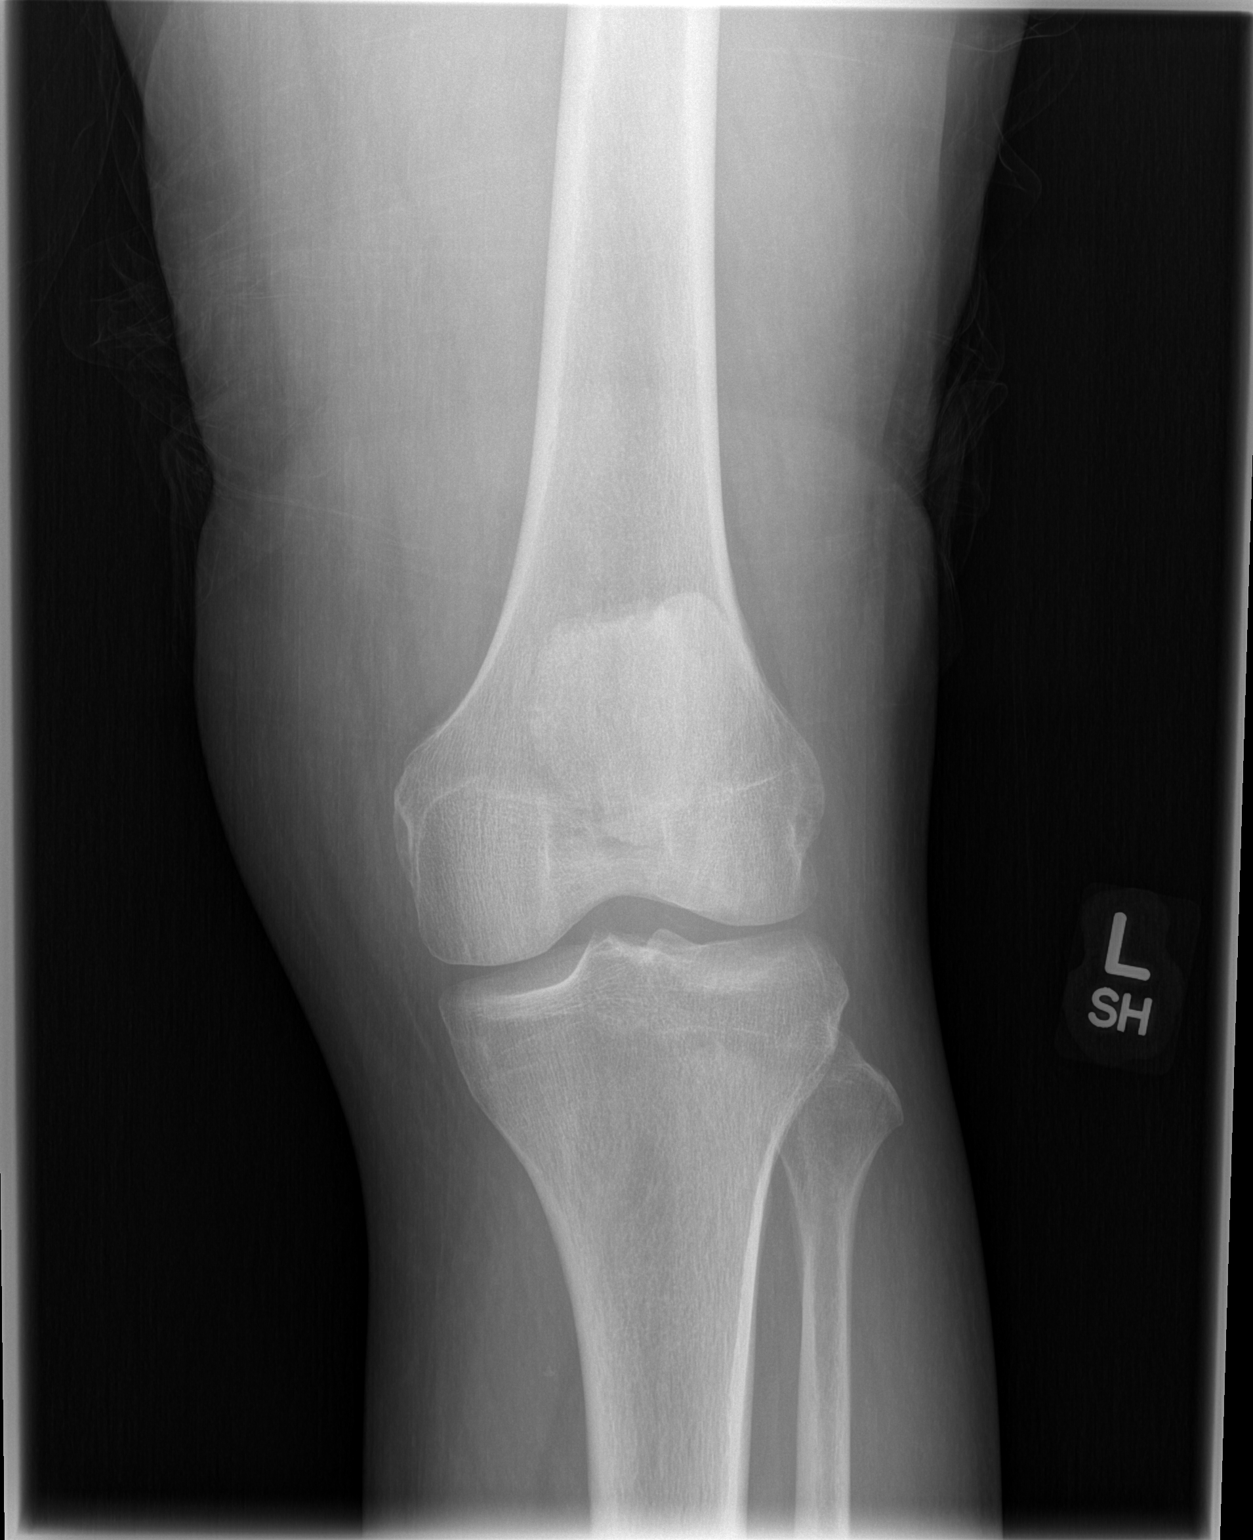

[t knee lat left]
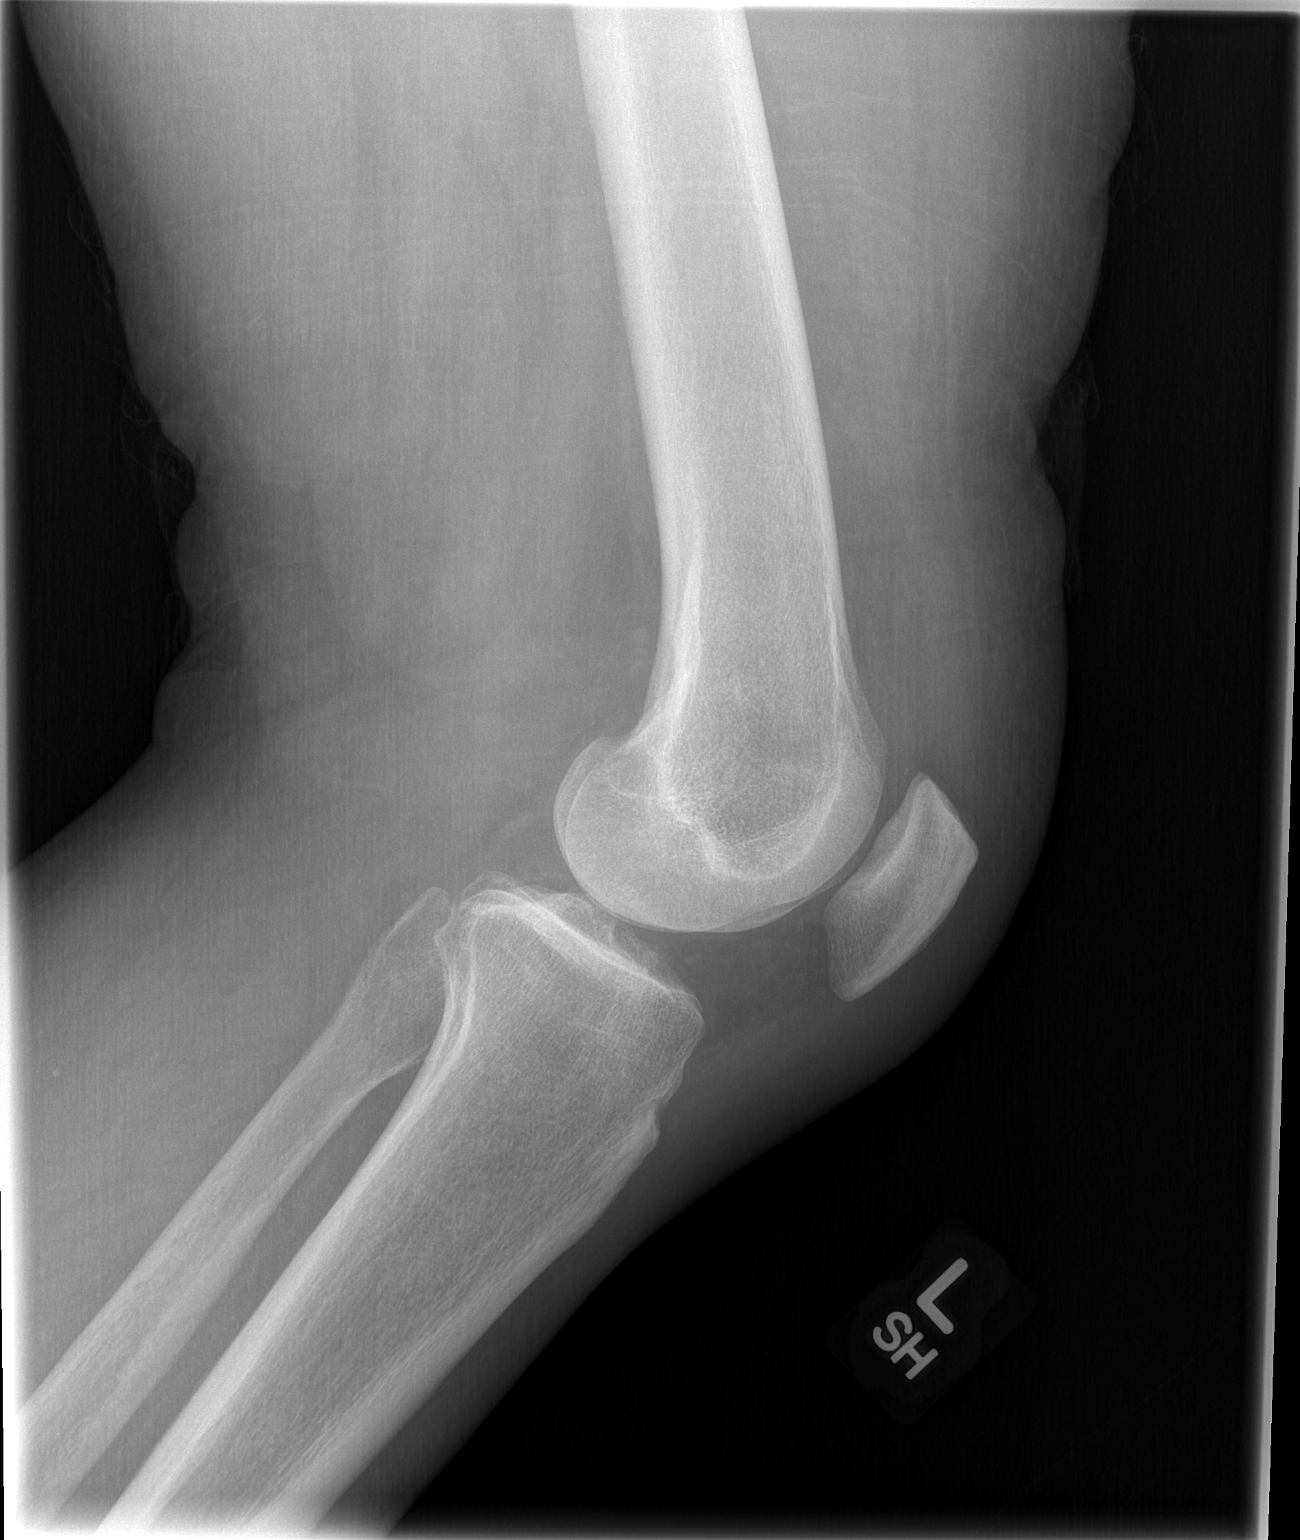

[2 of 2 positions shown; findings below may reference images not displayed]

FINDINGS: No fracture or dislocation is seen.

The joint spaces are preserved.

Mild prepatellar soft tissue swelling. Small suprapatellar knee
joint effusion.
IMPRESSION: No fracture or dislocation is seen.

Mild prepatellar soft tissue swelling.
